# Patient Record
Sex: Male | Born: 1995 | Race: Black or African American | Hispanic: No | Marital: Single | Smoking: Never smoker
Health system: Southern US, Community
[De-identification: ages and names within clinical notes are randomized; demographics above are authoritative.]

## PROBLEM LIST (undated history)

## (undated) DIAGNOSIS — J45909 Unspecified asthma, uncomplicated: Secondary | ICD-10-CM

## (undated) HISTORY — PX: EXTERNAL EAR SURGERY: SHX627

---

## 2017-03-15 ENCOUNTER — Encounter (HOSPITAL_BASED_OUTPATIENT_CLINIC_OR_DEPARTMENT_OTHER): Payer: Self-pay | Admitting: Emergency Medicine

## 2017-03-15 ENCOUNTER — Emergency Department (HOSPITAL_BASED_OUTPATIENT_CLINIC_OR_DEPARTMENT_OTHER): Payer: Federal, State, Local not specified - PPO

## 2017-03-15 ENCOUNTER — Other Ambulatory Visit: Payer: Self-pay

## 2017-03-15 ENCOUNTER — Emergency Department (HOSPITAL_BASED_OUTPATIENT_CLINIC_OR_DEPARTMENT_OTHER)
Admission: EM | Admit: 2017-03-15 | Discharge: 2017-03-15 | Disposition: A | Discharge status: Home / Self Care | Payer: Federal, State, Local not specified - PPO | Attending: Emergency Medicine | Admitting: Emergency Medicine

## 2017-03-15 DIAGNOSIS — Y939 Activity, unspecified: Secondary | ICD-10-CM | POA: Diagnosis not present

## 2017-03-15 DIAGNOSIS — Y929 Unspecified place or not applicable: Secondary | ICD-10-CM | POA: Insufficient documentation

## 2017-03-15 DIAGNOSIS — Y999 Unspecified external cause status: Secondary | ICD-10-CM | POA: Diagnosis not present

## 2017-03-15 DIAGNOSIS — W19XXXA Unspecified fall, initial encounter: Secondary | ICD-10-CM

## 2017-03-15 DIAGNOSIS — S0990XA Unspecified injury of head, initial encounter: Secondary | ICD-10-CM | POA: Diagnosis present

## 2017-03-15 DIAGNOSIS — W000XXA Fall on same level due to ice and snow, initial encounter: Secondary | ICD-10-CM | POA: Insufficient documentation

## 2017-03-15 MED ORDER — IBUPROFEN 400 MG PO TABS
600.0000 mg | ORAL_TABLET | Freq: Once | ORAL | Status: AC
  Administered 2017-03-15: 600 mg via ORAL
  Filled 2017-03-15: qty 1

## 2017-03-15 NOTE — ED Triage Notes (Signed)
Pt slipped on ice this am.  Hit back of head, no loc.  Some dots in vision occasionally.  No N/V.  Pain to touch.

## 2017-03-15 NOTE — Discharge Instructions (Signed)
Ibuprofen or Tylenol for pain. Your imaging and exam today is reassuring. You may have a mild concussion, take it easy for the next 48-72 hours, limit screen time, and rest your brain as we discussed. If you have worsening pain, vomiting, severe headache, vision changes or other concerning symptoms return to the ED for reevaluation, otherwise please call to schedule follow-up appointment with the [facility].

## 2017-03-15 NOTE — ED Notes (Signed)
Patient back from x-ray 

## 2017-03-15 NOTE — ED Provider Notes (Signed)
[facility] EMERGENCY DEPARTMENT Provider Note   CSN: [id number] Arrival date & time: 03/15/17  0957     History   Chief Complaint Chief Complaint  Patient presents with  . Fall    HPI   Chad Conley is a 21 y.o. Male with no pertinent past medical history, presents after she slipped on the ice this morning and fell backwards hitting his head. Patient denies any loss of consciousness reports seeing some dots in his vision occasionally, reports some nausea which has since resolved, no episodes of vomiting. He reports he hit the back left side of his head, which is tender to the touch. Patient also reports some left-sided neck pain that wraps around over his collarbone and sternum. Patient denies any numbness, weakness or tingling of extremities. Patient does report a headache that starts at the back of his head and goes up over the top of his head.       No past medical history on file.  There are no active problems to display for this patient.       Home Medications    Prior to Admission medications   Not on File    Family History No family history on file.  Social History Social History   Tobacco Use  . Smoking status: Never Smoker  . Smokeless tobacco: Never Used  Substance Use Topics  . Alcohol use: Not on file  . Drug use: Not on file     Allergies   Zithromax [azithromycin]   Review of Systems Review of Systems  Constitutional: Negative for chills and fever.  HENT: Negative for ear pain and nosebleeds.   Eyes: Negative for photophobia, pain and visual disturbance.  Respiratory: Negative for shortness of breath.   Cardiovascular: Negative for chest pain.  Gastrointestinal: Negative for abdominal pain, nausea and vomiting.  Musculoskeletal: Positive for myalgias and neck pain. Negative for arthralgias, gait problem and joint swelling.  Skin: Negative for rash and wound.  Neurological: Positive for headaches. Negative for dizziness,  seizures, syncope, facial asymmetry, speech difficulty, weakness, light-headedness and numbness.     Physical Exam Updated Vital Signs BP 127/61 (BP Location: Right Arm)   Pulse 72   Temp 98.7 F (37.1 C) (Oral)   Resp 14   Ht 5\' 9"  (1.753 m)   Wt 127 kg (280 lb)   SpO2 99%   BMI 41.35 kg/m   Physical Exam  Constitutional: He appears well-developed and well-nourished. No distress.  HENT:  Mild tenderness to palpation over left occiput with small hematoma palpable, no obvious deformity or step off, no battle sign, raccoon eyes or hemotympanum, bilateral TMs clear, no bony tenderness over the face, nose normal, posterior oropharynx clear and moist  Eyes: EOM are normal. Pupils are equal, round, and reactive to light. Right eye exhibits no discharge. Left eye exhibits no discharge.  No pain with EOMs  Neck:  C-spine nontender to palpation at midline or paraspinally some tenderness over the left lateral neck to palpation, nontender on the right, full range of motion of the neck in all directions with only minimal discomfort, no crepitus or deformity on palpation  Cardiovascular: Normal rate, regular rhythm, normal heart sounds and intact distal pulses.  Pulmonary/Chest: Effort normal and breath sounds normal. No stridor. No respiratory distress. He has no wheezes. He has no rales. He exhibits tenderness.  Patient does have some mild tenderness over the left clavicle and head of the sternum, no palpable crepitus or deformity, no tenderness over  the lateral ribs, chest expansion bilaterally, lungs clear to auscultation with good air movement throughout  Abdominal: Soft. Bowel sounds are normal. He exhibits no distension and no mass. There is no tenderness. There is no guarding.  Musculoskeletal:  T-spine and L-spine nontender to palpation at midline or paraspinally, no tenderness throughout the lumbar or thoracic back, normal range of motion.  All joints supple and easily movable without  notable deformity, all compartments soft  Neurological: He is alert. Coordination normal.  Speech is clear, able to follow commands CN III-XII intact Normal strength in upper and lower extremities bilaterally including dorsiflexion and plantar flexion, strong and equal grip strength Sensation normal to light and sharp touch Moves extremities without ataxia, coordination intact Normal finger to nose and rapid alternating movements No pronator drift  Skin: Skin is warm and dry. Capillary refill takes less than 2 seconds. He is not diaphoretic.  No lacerations or abrasions  Psychiatric: He has a normal mood and affect. His behavior is normal.  Nursing note and vitals reviewed.    ED Treatments / Results  Labs (all labs ordered are listed, but only abnormal results are displayed) Labs Reviewed - No data to display  EKG  EKG Interpretation None       Radiology Dg Chest 2 View  Result Date: 03/15/2017 CLINICAL DATA:  Fall.  Chest discomfort.  Nausea. EXAM: CHEST  2 VIEW COMPARISON:  No prior. FINDINGS: Mediastinum hilar structures normal. Heart size normal. No focal infiltrate. No pleural effusion or pneumothorax. No displaced rib fracture scratched it no acute bony abnormality identified. IMPRESSION: No active cardiopulmonary disease. Electronically Signed   By: Maisie Fushomas  Register   On: 03/15/2017 12:53   Ct Head Wo Contrast  Result Date: 03/15/2017 CLINICAL DATA:  Minor head trauma. Fall with posterior head injury. Initial encounter. EXAM: CT HEAD WITHOUT CONTRAST TECHNIQUE: Contiguous axial images were obtained from the base of the skull through the vertex without intravenous contrast. COMPARISON:  None. FINDINGS: Brain: No evidence of infarction, hemorrhage, hydrocephalus, extra-axial collection or mass lesion/mass effect. Vascular: No hyperdense vessel or unexpected calcification. Skull: Normal. Negative for fracture or focal lesion. Sinuses/Orbits: Negative. IMPRESSION: Negative  head CT. Electronically Signed   By: Marnee SpringJonathon  Watts M.D.   On: 03/15/2017 12:53    Procedures Procedures (including critical care time)  Medications Ordered in ED Medications  ibuprofen (ADVIL,MOTRIN) tablet 600 mg (600 mg Oral Given 03/15/17 1314)     Initial Impression / Assessment and Plan / ED Course  I have reviewed the triage vital signs and the nursing notes.  Pertinent labs & imaging results that were available during my care of the patient were reviewed by me and considered in my medical decision making (see chart for details).  Patient presents after he slipped on the ice and hit the back of his head, no LOC, the patient complaining of persistent headache since then. There is tenderness and mild swelling over the left occiput. No neurologic deficits on exam, and no signs of basilar skull fracture, but given continued headache and swelling will get a noncontrasted head CT to ensure no fracture or intracranial abnormality. C-spine cleared via Nexus criteria, tenderness likely due to muscle strain. Patient did have some mild tenderness over the sternum and left clavicle will get chest x-ray to rule out acute bony abnormality.  CT head without acute fracture or intracranial abnormality, and chest x-ray negative for fractures or active cardiopulmonary disease. Patient likely has mild concussion. Tylenol/ibuprofen for pain, discussed brain rest  for the next few days, and follow up with PCP. Strict return precautions discussed patient expresses understanding and is in agreement with plan.  Final Clinical Impressions(s) / ED Diagnoses   Final diagnoses:  Injury of head, initial encounter  Fall, initial encounter    ED Discharge Orders    None       Dartha LodgeFord, Averie Meiner N, [place] JerseyPA-C 03/16/17 0813    Ward, Layla MawKristen N, DO 03/21/17 2304

## 2019-04-14 ENCOUNTER — Encounter (HOSPITAL_COMMUNITY): Payer: Self-pay | Admitting: Emergency Medicine

## 2019-04-14 ENCOUNTER — Other Ambulatory Visit: Payer: Self-pay

## 2019-04-14 ENCOUNTER — Emergency Department (HOSPITAL_COMMUNITY): Payer: Federal, State, Local not specified - PPO

## 2019-04-14 ENCOUNTER — Emergency Department (HOSPITAL_COMMUNITY)
Admission: EM | Admit: 2019-04-14 | Discharge: 2019-04-14 | Disposition: A | Discharge status: Home / Self Care | Payer: Federal, State, Local not specified - PPO | Attending: Emergency Medicine | Admitting: Emergency Medicine

## 2019-04-14 DIAGNOSIS — M545 Low back pain, unspecified: Secondary | ICD-10-CM

## 2019-04-14 DIAGNOSIS — Y999 Unspecified external cause status: Secondary | ICD-10-CM | POA: Insufficient documentation

## 2019-04-14 DIAGNOSIS — Y93I9 Activity, other involving external motion: Secondary | ICD-10-CM | POA: Diagnosis not present

## 2019-04-14 DIAGNOSIS — M25512 Pain in left shoulder: Secondary | ICD-10-CM | POA: Diagnosis present

## 2019-04-14 DIAGNOSIS — J45909 Unspecified asthma, uncomplicated: Secondary | ICD-10-CM | POA: Diagnosis not present

## 2019-04-14 DIAGNOSIS — Y92410 Unspecified street and highway as the place of occurrence of the external cause: Secondary | ICD-10-CM | POA: Insufficient documentation

## 2019-04-14 HISTORY — DX: Unspecified asthma, uncomplicated: J45.909

## 2019-04-14 MED ORDER — METHOCARBAMOL 500 MG PO TABS: 500.0000 mg | ORAL_TABLET | Freq: Two times a day (BID) | ORAL | 0 refills | Status: AC

## 2019-04-14 MED ORDER — NAPROXEN 500 MG PO TABS: 500.0000 mg | ORAL_TABLET | Freq: Two times a day (BID) | ORAL | 0 refills | Status: AC

## 2019-04-14 MED ORDER — METHOCARBAMOL 500 MG PO TABS
500.0000 mg | ORAL_TABLET | Freq: Once | ORAL | Status: AC
  Administered 2019-04-14: 500 mg via ORAL
  Filled 2019-04-14: qty 1

## 2019-04-14 MED ORDER — ACETAMINOPHEN 500 MG PO TABS
1000.0000 mg | ORAL_TABLET | Freq: Once | ORAL | Status: AC
  Administered 2019-04-14: 1000 mg via ORAL
  Filled 2019-04-14: qty 2
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
## 2019-04-14 NOTE — Discharge Instructions (Signed)

## 2019-04-18 ENCOUNTER — Telehealth: Payer: Self-pay | Admitting: Orthopedic Surgery

## 2019-04-18 NOTE — Telephone Encounter (Signed)
Pt called in said he was seen at [facility] on 04-14-2019 for Acute pain of left shoulder/ Lumbar back pain and was told to follow up with dr.dean in 1 week and as of right now dr.dean schedule is booked 2 weeks out next available is 04-30-2019. When would you like to get him worked in, so I can call pt to schedule?  [phone]

## 2019-04-19 ENCOUNTER — Ambulatory Visit (INDEPENDENT_AMBULATORY_CARE_PROVIDER_SITE_OTHER): Payer: Federal, State, Local not specified - PPO

## 2019-04-19 ENCOUNTER — Ambulatory Visit (INDEPENDENT_AMBULATORY_CARE_PROVIDER_SITE_OTHER): Payer: Federal, State, Local not specified - PPO | Admitting: Orthopedic Surgery

## 2019-04-19 ENCOUNTER — Encounter: Payer: Self-pay | Admitting: Orthopedic Surgery

## 2019-04-19 ENCOUNTER — Other Ambulatory Visit: Payer: Self-pay

## 2019-04-19 DIAGNOSIS — M542 Cervicalgia: Secondary | ICD-10-CM

## 2019-04-19 DIAGNOSIS — M792 Neuralgia and neuritis, unspecified: Secondary | ICD-10-CM | POA: Diagnosis not present

## 2019-04-19 DIAGNOSIS — M545 Low back pain, unspecified: Secondary | ICD-10-CM

## 2019-04-19 MED ORDER — PREDNISONE 10 MG (21) PO TBPK: ORAL_TABLET | ORAL | 0 refills | Status: AC

## 2019-04-19 NOTE — Telephone Encounter (Signed)
IC s/w patient he will try to make it this morning for an appointment at 845.  If not he will call back and reschedule to Dr Alfonso Patten next available appt
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
## 2019-05-02 ENCOUNTER — Other Ambulatory Visit: Payer: Self-pay

## 2019-05-02 ENCOUNTER — Ambulatory Visit (INDEPENDENT_AMBULATORY_CARE_PROVIDER_SITE_OTHER): Payer: Federal, State, Local not specified - PPO | Admitting: Physical Therapy

## 2019-05-02 ENCOUNTER — Encounter: Payer: Self-pay | Admitting: Physical Therapy

## 2019-05-02 DIAGNOSIS — M25512 Pain in left shoulder: Secondary | ICD-10-CM

## 2019-05-02 DIAGNOSIS — M545 Low back pain, unspecified: Secondary | ICD-10-CM

## 2019-05-02 DIAGNOSIS — M542 Cervicalgia: Secondary | ICD-10-CM | POA: Diagnosis not present
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
## 2019-05-02 NOTE — Patient Instructions (Signed)
Access Code: [email or web address] URL: [email or web address] Date: 05/02/2019 Prepared by: Ivery Quale  Exercises Seated Cervical Sidebending Stretch - 3 sets - 30 hold - 2x daily - 6x weekly Seated Cervical Retraction - 10 reps - 3 sets - 2x daily - 6x weekly Seated Assisted Cervical Rotation with Towel - 10 reps - 1-2 sets - 5 hold - 2x daily - 6x weekly Standing Shoulder Row with Anchored Resistance - 10 reps - 2-3 sets - 2x daily - 6x weekly Shoulder Extension with Resistance - Neutral - 10 reps - 2-3 sets - 2x daily - 6x weekly Supine Lower Trunk Rotation - 10 reps - 1 sets - 5 sec hold - 2x daily - 6x weekly Supine Bridge - 10 reps - 1-2 sets - 5 hold - 2x daily - 6x weekly Child's Pose Stretch - 3 sets - 30 hold - 2x daily - 6x weekly Supine Shoulder Flexion with Dowel - 10 reps - 1-2 sets - 2x daily - 6x weekly Patient Education TENS Therapy

## 2019-05-17 ENCOUNTER — Other Ambulatory Visit: Payer: Self-pay

## 2019-05-17 ENCOUNTER — Ambulatory Visit (INDEPENDENT_AMBULATORY_CARE_PROVIDER_SITE_OTHER): Payer: Federal, State, Local not specified - PPO | Admitting: Orthopedic Surgery

## 2019-05-17 DIAGNOSIS — M542 Cervicalgia: Secondary | ICD-10-CM | POA: Diagnosis not present

## 2019-05-18 ENCOUNTER — Telehealth: Payer: Self-pay | Admitting: Physical Therapy

## 2019-05-18 ENCOUNTER — Encounter: Payer: Self-pay | Admitting: Orthopedic Surgery

## 2019-05-18 ENCOUNTER — Encounter: Payer: Federal, State, Local not specified - PPO | Admitting: Physical Therapy

## 2019-05-18 NOTE — Telephone Encounter (Signed)
Preferred contact number is unavailable/ out of service at this time. Attempted to call home number but was informed it was the wrong number.  Dezmen Alcock PT, DPT, LAT, ATC  05/18/19  2:27 PM
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
## 2019-05-25 ENCOUNTER — Encounter: Payer: Self-pay | Admitting: Physical Therapy

## 2019-05-25 ENCOUNTER — Other Ambulatory Visit: Payer: Self-pay

## 2019-05-25 ENCOUNTER — Ambulatory Visit (INDEPENDENT_AMBULATORY_CARE_PROVIDER_SITE_OTHER): Payer: Federal, State, Local not specified - PPO | Admitting: Physical Therapy

## 2019-05-25 DIAGNOSIS — M542 Cervicalgia: Secondary | ICD-10-CM

## 2019-05-25 DIAGNOSIS — M25512 Pain in left shoulder: Secondary | ICD-10-CM | POA: Diagnosis not present

## 2019-05-25 DIAGNOSIS — M545 Low back pain, unspecified: Secondary | ICD-10-CM
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
## 2019-05-30 ENCOUNTER — Other Ambulatory Visit: Payer: Self-pay

## 2019-05-30 ENCOUNTER — Ambulatory Visit (INDEPENDENT_AMBULATORY_CARE_PROVIDER_SITE_OTHER): Payer: Federal, State, Local not specified - PPO | Admitting: Physical Therapy

## 2019-05-30 DIAGNOSIS — M542 Cervicalgia: Secondary | ICD-10-CM

## 2019-05-30 DIAGNOSIS — M25512 Pain in left shoulder: Secondary | ICD-10-CM | POA: Diagnosis not present

## 2019-05-30 DIAGNOSIS — M545 Low back pain, unspecified: Secondary | ICD-10-CM

## 2019-05-30 NOTE — Patient Instructions (Signed)
Access Code: [email or web address]  URL: [email or web address]  Date: 05/30/2019  Prepared by: Ivery Quale   Exercises  Seated Cervical Sidebending Stretch - 3 sets - 30 hold - 2x daily - 6x weekly  Standing Shoulder Row with Anchored Resistance - 10 reps - 2-3 sets - 2x daily - 6x weekly  Standing Shoulder Horizontal Abduction with Resistance - 10 reps - 3 sets - 2x daily - 6x weekly  Shoulder Extension with Resistance - Neutral - 10 reps - 2-3 sets - 2x daily - 6x weekly  Standing Shoulder External Rotation with Resistance - 10 reps - 1-3 sets - 2x daily - 6x weekly  Seated Hamstring Stretch - 2 reps - 1 sets - 30 hold - 2x daily - 6x weekly  Supine Lower Trunk Rotation - 10 reps - 1 sets - 5 sec hold - 2x daily - 6x weekly  Supine Bridge - 10 reps - 1-2 sets - 5 hold - 2x daily - 6x weekly  Bird Dog - 10 reps - 1 sets - 5 sec hold - 2x daily - 6x weekly  Cat Cow - 10 reps - 1 sets - 5 hold - 2x daily - 6x weekly

## 2019-05-30 NOTE — Therapy (Signed)
[place], [place], [address] Phone: [phone]   Fax:  [phone]  Physical Therapy Treatment  Patient Details  Name: Chad Conley MRN: [id number] Date of Birth: [birth year] Referring Provider (PT): Donella Stade, [place]   Encounter Date: 05/30/2019  PT End of Session - 05/30/19 1241    Visit Number  3    Number of Visits  12    Date for PT Re-Evaluation  06/13/19    PT Start Time  1102    PT Stop Time  1147    PT Time Calculation (min)  45 min    Activity Tolerance  Patient tolerated treatment well    Behavior During Therapy  [facility] for tasks assessed/performed       Past Medical History:  Diagnosis Date  . Asthma     Past Surgical History:  Procedure Laterality Date  . EXTERNAL EAR SURGERY      There were no vitals filed for this visit.  Subjective Assessment - 05/30/19 1123    Subjective  Pain overall not bad today about 3/10 for back. My neck and shoulder are not bothering me today    Pertinent History  no significant PMH    Limitations  Lifting;Standing;Walking;House hold activities    How long can you stand comfortably?  depends    How long can you walk comfortably?  10 min    Diagnostic tests  "x-rays were negative for any acute fracture/dislocation and the only finding was loss of lordosis in the lumbar spine."    Pain Onset  More than a month ago                       [facility] Adult PT Treatment/Exercise - 05/30/19 0001      Lumbar Exercises: Stretches   Active Hamstring Stretch  Right;Left;2 reps;30 seconds    Active Hamstring Stretch Limitations  seated    Single Knee to Chest Stretch  Right;Left;2 reps;30 seconds    Double Knee to Chest Stretch Limitations  10 reps X 10 seconds with feet on Pball    Lower Trunk Rotation Limitations  20 reps      Lumbar Exercises: Aerobic   Nustep  L6 x 6 min UE/LE       Lumbar Exercises: Supine   Bridge  15 reps    Bridge Limitations   with feet on pball    Straight Leg Raise  20 reps      Lumbar Exercises: Quadruped   Madcat/Old Horse  10 reps    Opposite Arm/Leg Raise  10 reps      Shoulder Exercises: Standing   Horizontal ABduction  20 reps    Theraband Level (Shoulder Horizontal ABduction)  Level 3 (Green)    External Rotation Limitations  bilat ER with L3 green X 20 reps    Extension  20 reps    Theraband Level (Shoulder Extension)  Level 3 (Green)    Row  20 reps    Theraband Level (Shoulder Row)  Level 3 (Green)      Manual Therapy   Manual therapy comments  MET contract relax with passive hamsting stretchin on Lt side.                   PT Long Term Goals - 05/02/19 2012      PT LONG TERM GOAL #1   Title  Pt will be I and compliant with HEP. (Target for all goals 6  weeks 06/13/19)    Status  New      PT LONG TERM GOAL #2   Title  Pt will improve overal neck/lumbar ROM to WNL.    Status  New      PT LONG TERM GOAL #3   Title  Pt will reduce overall pain to no more than 2/10 with ususal activity.    Status  New            Plan - 05/30/19 1239    Clinical Impression Statement  His neck and shoulder are doing well so focused primarily on mid/low back strength progression with good tolreance and without complaints. He was given updated HEP to reflect his progress. Continue POC    PT Treatment/Interventions  ADLs/Self Care Home Management;Cryotherapy;Electrical Stimulation;Moist Heat;Iontophoresis 18m/ml Dexamethasone;Traction;Ultrasound;Therapeutic activities;Therapeutic exercise;Neuromuscular re-education;Manual techniques;Passive range of motion;Taping;Spinal Manipulations;Joint Manipulations    PT Next Visit Plan  needs neck/lumbar strengtening and core/general exercises, SIJ involvement of L low back stretching hamstrings and strengthening hip flexors    PT Home Exercise Plan  Access Code: [email or web address] hamstring stretch (supine/ seated), MET of hip flexors, SRL    Consulted and Agree  with Plan of Care  Patient       Patient will benefit from skilled therapeutic intervention in order to improve the following deficits and impairments:  Decreased activity tolerance, Decreased range of motion, Decreased strength, Difficulty walking, Impaired flexibility, Increased muscle spasms, Increased fascial restricitons, Pain  Visit Diagnosis: Cervicalgia  Acute pain of left shoulder  Acute bilateral low back pain without sciatica     Problem List There are no problems to display for this patient.   BSilvestre Mesi2/24/2021, 12:42 PM  CGrand[facility] CenterPhysical Therapy [address] [address]Phone: [phone]  Fax:  [phone] Name: Chad GenovaMRN: [id number] of Birth: [birth year]

## 2019-06-01 ENCOUNTER — Ambulatory Visit (INDEPENDENT_AMBULATORY_CARE_PROVIDER_SITE_OTHER): Payer: Federal, State, Local not specified - PPO | Admitting: Physical Therapy

## 2019-06-01 ENCOUNTER — Other Ambulatory Visit: Payer: Self-pay

## 2019-06-01 DIAGNOSIS — M542 Cervicalgia: Secondary | ICD-10-CM

## 2019-06-01 DIAGNOSIS — M25512 Pain in left shoulder: Secondary | ICD-10-CM | POA: Diagnosis not present

## 2019-06-01 DIAGNOSIS — M545 Low back pain, unspecified: Secondary | ICD-10-CM
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
## 2019-06-05 ENCOUNTER — Other Ambulatory Visit: Payer: Self-pay

## 2019-06-05 ENCOUNTER — Ambulatory Visit (INDEPENDENT_AMBULATORY_CARE_PROVIDER_SITE_OTHER): Payer: Federal, State, Local not specified - PPO | Admitting: Physical Therapy

## 2019-06-05 DIAGNOSIS — M25512 Pain in left shoulder: Secondary | ICD-10-CM

## 2019-06-05 DIAGNOSIS — M545 Low back pain, unspecified: Secondary | ICD-10-CM

## 2019-06-05 DIAGNOSIS — M542 Cervicalgia: Secondary | ICD-10-CM
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
## 2019-06-07 ENCOUNTER — Ambulatory Visit (INDEPENDENT_AMBULATORY_CARE_PROVIDER_SITE_OTHER): Payer: Federal, State, Local not specified - PPO | Admitting: Physical Therapy

## 2019-06-07 ENCOUNTER — Other Ambulatory Visit: Payer: Self-pay

## 2019-06-07 DIAGNOSIS — M545 Low back pain, unspecified: Secondary | ICD-10-CM

## 2019-06-07 DIAGNOSIS — M542 Cervicalgia: Secondary | ICD-10-CM | POA: Diagnosis not present

## 2019-06-07 DIAGNOSIS — M25512 Pain in left shoulder: Secondary | ICD-10-CM

## 2019-06-07 NOTE — Therapy (Signed)
[place], [place], [address] Phone: [phone]   Fax:  [phone]  Physical Therapy Treatment  Patient Details  Name: Chad Conley MRN: [id number] Date of Birth: 26-Feb-1996 Referring Provider (PT): Donella Stade, [place]   Encounter Date: 06/07/2019  PT End of Session - 06/07/19 1012    Visit Number  6    Number of Visits  12    Date for PT Re-Evaluation  06/13/19    PT Start Time  0938    PT Stop Time  1016    PT Time Calculation (min)  38 min    Activity Tolerance  Patient tolerated treatment well    Behavior During Therapy  [facility] for tasks assessed/performed       Past Medical History:  Diagnosis Date  . Asthma     Past Surgical History:  Procedure Laterality Date  . EXTERNAL EAR SURGERY      There were no vitals filed for this visit.  Subjective Assessment - 06/07/19 0958    Subjective  My back is doing pretty good today, did not have back pain until the end of my work shift.    Pertinent History  no significant PMH    Limitations  Lifting;Standing;Walking;House hold activities    How long can you stand comfortably?  depends    How long can you walk comfortably?  10 min    Diagnostic tests  "x-rays were negative for any acute fracture/dislocation and the only finding was loss of lordosis in the lumbar spine."    Pain Onset  More than a month ago            [facility] Adult PT Treatment/Exercise - 06/07/19 0001      Lumbar Exercises: Stretches   Single Knee to Chest Stretch  Right;Left;2 reps;30 seconds    Lower Trunk Rotation Limitations  10 reps holding 5 sec ea    Other Lumbar Stretch Exercise  seated lumbar flexion 20 sec X 3     Other Lumbar Stretch Exercise  quadriped thread the needle for thoracic rotation X 5 reps holding 10 seconds      Lumbar Exercises: Aerobic   Tread Mill  6 min, 3.mph      Lumbar Exercises: Standing   Row Limitations  bent over UE supported row 20 lb X 20 reps each  side    Other Standing Lumbar Exercises  farmers carry with 20 lb KB one lap for Lt, then one lap for Rt. KB curl and OH press  X15 ea side    Other Standing Lumbar Exercises  KB deadlift from 4 inch step 20 lbs  X15 reps, SL RDL with UE support 10 lbs X 15. goblet squat 20 lbs 2/x10 reps,          PT Long Term Goals - 06/05/19 1023      PT LONG TERM GOAL #1   Title  Pt will be I and compliant with HEP. (Target for all goals 6 weeks 06/13/19)    Status  On-going      PT LONG TERM GOAL #2   Title  Pt will improve overal neck/lumbar ROM to WNL.    Status  On-going      PT LONG TERM GOAL #3   Title  Pt will reduce overall pain to no more than 2/10 with ususal activity.    Status  On-going            Plan - 06/07/19 1013  Clinical Impression Statement  Continued with lumbar stretching and progression of functional strength program with good tolerance. He is overall progressing very well with PT.    PT Treatment/Interventions  ADLs/Self Care Home Management;Cryotherapy;Electrical Stimulation;Moist Heat;Iontophoresis 51m/ml Dexamethasone;Traction;Ultrasound;Therapeutic activities;Therapeutic exercise;Neuromuscular re-education;Manual techniques;Passive range of motion;Taping;Spinal Manipulations;Joint Manipulations    PT Next Visit Plan  lumbar stretching, progress functional strength as tolerated    PT Home Exercise Plan  Access Code: [email or web address] hamstring stretch (supine/ seated), MET of hip flexors, SRL    Consulted and Agree with Plan of Care  Patient       Patient will benefit from skilled therapeutic intervention in order to improve the following deficits and impairments:  Decreased activity tolerance, Decreased range of motion, Decreased strength, Difficulty walking, Impaired flexibility, Increased muscle spasms, Increased fascial restricitons, Pain  Visit Diagnosis: Cervicalgia  Acute pain of left shoulder  Acute bilateral low back pain without sciatica     Problem  List There are no problems to display for this patient.   BDebbe Odea PT,DPT 06/07/2019, 10:19 AM  CPres[facility] CenterPhysical Therapy [address] [address]Phone: [phone]  Fax:  [phone] Name: Chad BonsellMRN: [id number] of Birth: 703-01-1996

## 2019-06-12 ENCOUNTER — Other Ambulatory Visit: Payer: Self-pay

## 2019-06-12 ENCOUNTER — Ambulatory Visit (INDEPENDENT_AMBULATORY_CARE_PROVIDER_SITE_OTHER): Payer: Federal, State, Local not specified - PPO | Admitting: Physical Therapy

## 2019-06-12 DIAGNOSIS — M545 Low back pain, unspecified: Secondary | ICD-10-CM

## 2019-06-12 DIAGNOSIS — M25512 Pain in left shoulder: Secondary | ICD-10-CM

## 2019-06-12 DIAGNOSIS — M542 Cervicalgia: Secondary | ICD-10-CM

## 2019-06-12 NOTE — Therapy (Signed)
[place], [place], [address] Phone: [phone]   Fax:  [phone]  Physical Therapy Treatment  Patient Details  Name: Chad Conley MRN: [id number] Date of Birth: 05/27/1995 Referring Provider (PT): Donella Stade, [place]   Encounter Date: 06/12/2019  PT End of Session - 06/12/19 1010    Visit Number  7    Number of Visits  12    Date for PT Re-Evaluation  06/13/19    PT Start Time  0935    PT Stop Time  1015    PT Time Calculation (min)  40 min    Activity Tolerance  Patient tolerated treatment well       Past Medical History:  Diagnosis Date  . Asthma     Past Surgical History:  Procedure Laterality Date  . EXTERNAL EAR SURGERY      There were no vitals filed for this visit.  Subjective Assessment - 06/12/19 0942    Subjective  My back is doing pretty good today but am having some soreness in my back and some cramping in my shins.    Pertinent History  no significant PMH    Limitations  Lifting;Standing;Walking;House hold activities    How long can you stand comfortably?  depends    How long can you walk comfortably?  10 min    Diagnostic tests  "x-rays were negative for any acute fracture/dislocation and the only finding was loss of lordosis in the lumbar spine."    Pain Onset  More than a month ago         [facility] Adult PT Treatment/Exercise - 06/12/19 0001      Lumbar Exercises: Stretches   Single Knee to Chest Stretch  Right;Left;2 reps;30 seconds    Lower Trunk Rotation Limitations  10 reps holding 5 sec ea    Other Lumbar Stretch Exercise  child pose stretch for lumbar and anterior shin stretching 30 sec X 3      Lumbar Exercises: Aerobic   Tread Mill  6 min, 3.mph      Lumbar Exercises: Standing   Other Standing Lumbar Exercises  farmers carry with 20 lb KB one lap for Lt, then one lap for Rt. KB curl and OH press  X15 ea side, Bent over row with one UE support 20 lbs X 20 reps ea side     Other Standing Lumbar Exercises  KB deadlift from 2 inch step 20 lbs 2X10 reps, SL RDL with UE support 10 lbs X 15. goblet squat 20 lbs 2/x10 reps to chair      Manual Therapy   Manual therapy comments  manual stretching for posterior and anterior shin       PT Long Term Goals - 06/05/19 1023      PT LONG TERM GOAL #1   Title  Pt will be I and compliant with HEP. (Target for all goals 6 weeks 06/13/19)    Status  On-going      PT LONG TERM GOAL #2   Title  Pt will improve overal neck/lumbar ROM to WNL.    Status  On-going      PT LONG TERM GOAL #3   Title  Pt will reduce overall pain to no more than 2/10 with ususal activity.    Status  On-going            Plan - 06/12/19 1012    Clinical Impression Statement  Had some complaints of cramping in his shin  today but still able to perform his strength and conditioning program with good tolerance and with good efforts. He is now able to repetitively lift and carry 20 lbs at a time safely and without pain. PT will continue to build upon this as able.    PT Treatment/Interventions  ADLs/Self Care Home Management;Cryotherapy;Electrical Stimulation;Moist Heat;Iontophoresis 64m/ml Dexamethasone;Traction;Ultrasound;Therapeutic activities;Therapeutic exercise;Neuromuscular re-education;Manual techniques;Passive range of motion;Taping;Spinal Manipulations;Joint Manipulations    PT Next Visit Plan  lumbar stretching, progress functional strength as tolerated    PT Home Exercise Plan  Access Code: [email or web address] hamstring stretch (supine/ seated), MET of hip flexors, SRL    Consulted and Agree with Plan of Care  Patient       Patient will benefit from skilled therapeutic intervention in order to improve the following deficits and impairments:  Decreased activity tolerance, Decreased range of motion, Decreased strength, Difficulty walking, Impaired flexibility, Increased muscle spasms, Increased fascial restricitons, Pain  Visit  Diagnosis: Cervicalgia  Acute pain of left shoulder  Acute bilateral low back pain without sciatica     Problem List There are no problems to display for this patient.   BDebbe Odea, PT,DPT 06/12/2019, 10:23 AM  CShadow [facility] SystemPhysical Therapy [address] [address]Phone: [phone]  Fax:  [phone] Name: Chad ReggioMRN: [id number] of Birth: [birth year]

## 2019-06-14 ENCOUNTER — Ambulatory Visit (INDEPENDENT_AMBULATORY_CARE_PROVIDER_SITE_OTHER): Payer: Federal, State, Local not specified - PPO | Admitting: Physical Therapy

## 2019-06-14 ENCOUNTER — Other Ambulatory Visit: Payer: Self-pay

## 2019-06-14 DIAGNOSIS — M545 Low back pain, unspecified: Secondary | ICD-10-CM

## 2019-06-14 DIAGNOSIS — M25512 Pain in left shoulder: Secondary | ICD-10-CM | POA: Diagnosis not present

## 2019-06-14 DIAGNOSIS — M542 Cervicalgia: Secondary | ICD-10-CM

## 2019-06-14 NOTE — Therapy (Signed)
[facility] [place] [place], [place], [address] Phone: [phone]   Fax:  [phone]  Physical Therapy Treatment  Patient Details  Name: Chad Conley MRN: [id number] Date of Birth: May 21, 1995 Referring Provider (PT): Donella Stade, [place]   Encounter Date: 06/14/2019  PT End of Session - 06/14/19 0950    Visit Number  8    Number of Visits  12    Date for PT Re-Evaluation  06/29/19    PT Start Time  6503   he arrives 14 min late   PT Stop Time  1015    PT Time Calculation (min)  31 min    Activity Tolerance  Patient tolerated treatment well    Behavior During Therapy  [facility] for tasks assessed/performed       Past Medical History:  Diagnosis Date  . Asthma     Past Surgical History:  Procedure Laterality Date  . EXTERNAL EAR SURGERY      There were no vitals filed for this visit.  Subjective Assessment - 06/14/19 0949    Subjective  my back is a little sore but overall its doing good    Pertinent History  no significant PMH    Limitations  Lifting;Standing;Walking;House hold activities    How long can you stand comfortably?  depends    How long can you walk comfortably?  10 min    Diagnostic tests  "x-rays were negative for any acute fracture/dislocation and the only finding was loss of lordosis in the lumbar spine."    Pain Onset  More than a month ago           [facility] Adult PT Treatment/Exercise - 06/14/19 0001      Lumbar Exercises: Stretches   Single Knee to Chest Stretch  Right;Left;2 reps;30 seconds    Lower Trunk Rotation Limitations  10 reps holding 5 sec ea    Other Lumbar Stretch Exercise  child pose stretch for lumbar and anterior shin stretching 30 sec X 3      Lumbar Exercises: Aerobic   Recumbent Bike  6 min L3      Lumbar Exercises: Standing   Other Standing Lumbar Exercises  farmers carry with 25 lb KB one lap for Lt, then one lap for Rt. Bent over row with one UE support 25 lbs X 20 reps ea side.      Other Standing Lumbar Exercises  KB deadlift from floor 20 lbs 2X10 reps, SL RDL without UE support 10 lbs X 15. goblet squat 15 lbs 2/x10 reps to chair                  PT Long Term Goals - 06/05/19 1023      PT LONG TERM GOAL #1   Title  Pt will be I and compliant with HEP. (Target for all goals 6 weeks 06/13/19)    Status  On-going      PT LONG TERM GOAL #2   Title  Pt will improve overal neck/lumbar ROM to WNL.    Status  On-going      PT LONG TERM GOAL #3   Title  Pt will reduce overall pain to no more than 2/10 with ususal activity.    Status  On-going            Plan - 06/14/19 0951    Clinical Impression Statement  arrives 14 min late so some exercises/activiteis held due to time retraints. Overall he was made good progress  with PT, recommending 4 more visits of PT to safely progress his lifting/carrying then discharge.    Examination-Activity Limitations  Bend;Carry;Squat;Reach Overhead;Locomotion Level;Lift;Stand    PT Treatment/Interventions  ADLs/Self Care Home Management;Cryotherapy;Electrical Stimulation;Moist Heat;Iontophoresis 29m/ml Dexamethasone;Traction;Ultrasound;Therapeutic activities;Therapeutic exercise;Neuromuscular re-education;Manual techniques;Passive range of motion;Taping;Spinal Manipulations;Joint Manipulations    PT Next Visit Plan  lumbar stretching, progress functional strength as tolerated    PT Home Exercise Plan  Access Code: [email or web address] hamstring stretch (supine/ seated), MET of hip flexors, SRL    Consulted and Agree with Plan of Care  Patient       Patient will benefit from skilled therapeutic intervention in order to improve the following deficits and impairments:  Decreased activity tolerance, Decreased range of motion, Decreased strength, Difficulty walking, Impaired flexibility, Increased muscle spasms, Increased fascial restricitons, Pain  Visit Diagnosis: Cervicalgia  Acute pain of left shoulder  Acute bilateral low  back pain without sciatica     Problem List There are no problems to display for this patient.   BDebbe Odea PT,DPT 06/14/2019, 10:21 AM  [facility] [address] [address]Phone: [phone]  Fax:  [phone] Name: Chad GallagaMRN: [id number] of Birth: [birth year]

## 2019-06-20 ENCOUNTER — Encounter: Payer: Self-pay | Admitting: Physical Therapy

## 2019-06-20 ENCOUNTER — Ambulatory Visit (INDEPENDENT_AMBULATORY_CARE_PROVIDER_SITE_OTHER): Payer: Federal, State, Local not specified - PPO | Admitting: Physical Therapy

## 2019-06-20 ENCOUNTER — Other Ambulatory Visit: Payer: Self-pay

## 2019-06-20 DIAGNOSIS — M542 Cervicalgia: Secondary | ICD-10-CM

## 2019-06-20 DIAGNOSIS — M545 Low back pain, unspecified: Secondary | ICD-10-CM

## 2019-06-20 DIAGNOSIS — M25512 Pain in left shoulder: Secondary | ICD-10-CM

## 2019-06-20 NOTE — Therapy (Addendum)
[facility] [place], [place], [address] Phone: [phone]   Fax:  [phone]  Physical Therapy Treatment Discharge  Patient Details  Name: Chad Conley MRN: [id number] Date of Birth: 1995-05-03 Referring Provider (PT): Donella Stade, [place]   Encounter Date: 06/20/2019  PT End of Session - 06/20/19 1412    Visit Number  9    Number of Visits  12    Date for PT Re-Evaluation  06/29/19    PT Start Time  0867    PT Stop Time  1446    PT Time Calculation (min)  38 min    Activity Tolerance  Patient tolerated treatment well    Behavior During Therapy  [facility] for tasks assessed/performed       Past Medical History:  Diagnosis Date  . Asthma     Past Surgical History:  Procedure Laterality Date  . EXTERNAL EAR SURGERY      There were no vitals filed for this visit.  Subjective Assessment - 06/20/19 1410    Subjective  Pt arriving to therapy just after his shift ended at [facility]. Pt reports he may be a little tired. No pain reported.    Pertinent History  no significant PMH    Limitations  Lifting;Standing;Walking;House hold activities    How long can you stand comfortably?  depends    How long can you walk comfortably?  10 min    Diagnostic tests  "x-rays were negative for any acute fracture/dislocation and the only finding was loss of lordosis in the lumbar spine."    Currently in Pain?  No/denies                       [facility] Adult PT Treatment/Exercise - 06/20/19 0001      Lumbar Exercises: Stretches   Single Knee to Chest Stretch  Right;Left;2 reps;30 seconds    Hip Flexor Stretch  Right;Left;1 rep;60 seconds    Other Lumbar Stretch Exercise  child pose stretch for lumbar and anterior shin stretching 30 sec X 3      Lumbar Exercises: Aerobic   Recumbent Bike  6 min L4      Lumbar Exercises: Standing   Other Standing Lumbar Exercises  Airex: diagnal "X" pattern with LE, BOSU ball partial squats dome  up x 10     Other Standing Lumbar Exercises  KB deadlift from floor 20 lbs 2X10 reps, SL RDL without UE support 10 lbs X 15. goblet squat 15 lbs 2/x10 reps to chair      Shoulder Exercises: Standing   Row  AROM;Strengthening;20 reps;Theraband    Theraband Level (Shoulder Row)  Level 3 (Green)    Diagonals  AROM;Strengthening;Both;Theraband    Theraband Level (Shoulder Diagonals)  Level 3 (Green)    Other Standing Exercises  15# kettle bell swings x 20 reps             PT Education - 06/20/19 1412    Education Details  reviewed HEP verbally    Person(s) Educated  Patient    Methods  Explanation    Comprehension  Verbalized understanding          PT Long Term Goals - 06/20/19 1413      PT LONG TERM GOAL #1   Title  Pt will be I and compliant with HEP. (Target for all goals 6 weeks 06/13/19)    Status  On-going      PT LONG TERM GOAL #2  Title  Pt will improve overal neck/lumbar ROM to WNL.    Status  On-going      PT LONG TERM GOAL #3   Title  Pt will reduce overall pain to no more than 2/10 with ususal activity.    Status  On-going            Plan - 06/20/19 1446    Clinical Impression Statement  Pt arriving to therapy reporting no pain at rest. Pt tolerating all exericses focusing on core and lift/carry. 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    Examination-Activity Limitations  Bend;Carry;Squat;Reach Overhead;Locomotion Level;Lift;Stand    Examination-Participation Restrictions  Community Activity;Cleaning;Shop;Other    Stability/Clinical Decision Making  Stable/Uncomplicated    Rehab Potential  Good    PT Frequency  2x / week    PT Duration  6 weeks    PT Treatment/Interventions  ADLs/Self Care Home Management;Cryotherapy;Electrical Stimulation;Moist Heat;Iontophoresis 66m/ml Dexamethasone;Traction;Ultrasound;Therapeutic activities;Therapeutic exercise;Neuromuscular  re-education;Manual techniques;Passive range of motion;Taping;Spinal Manipulations;Joint Manipulations    PT Next Visit Plan  lumbar stretching, progress functional strength as tolerated    PT Home Exercise Plan  Access Code: [email or web address] hamstring stretch (supine/ seated), MET of hip flexors, SRL    Consulted and Agree with Plan of Care  Patient       Patient will benefit from skilled therapeutic intervention in order to improve the following deficits and impairments:  Decreased activity tolerance, Decreased range of motion, Decreased strength, Difficulty walking, Impaired flexibility, Increased muscle spasms, Increased fascial restricitons, Pain  Visit Diagnosis: Cervicalgia  Acute pain of left shoulder  Acute bilateral low back pain without sciatica  PHYSICAL THERAPY DISCHARGE SUMMARY  Visits from Start of Care: 9  Current functional level related to goals / functional outcomes: see above  Remaining deficits: See above   Education / Equipment: HEP Plan: Patient agrees to discharge.  Patient goals were not met. Patient is being discharged due to not returning since the last visit.  ?????        Problem List There are no problems to display for this patient.   JOretha Caprice PT 06/20/2019, 2:48 PM  [facility] [address] [address]Phone: [phone]  Fax:  [phone] Name: JTaz VannessMRN: [id number] of Birth: 7Sep 05, 1997

## 2019-06-22 ENCOUNTER — Encounter: Payer: Federal, State, Local not specified - PPO | Admitting: Physical Therapy

## 2019-06-26 ENCOUNTER — Encounter: Payer: Federal, State, Local not specified - PPO | Admitting: Physical Therapy

## 2019-06-29 ENCOUNTER — Telehealth: Payer: Self-pay | Admitting: Physical Therapy

## 2019-06-29 ENCOUNTER — Encounter: Payer: Federal, State, Local not specified - PPO | Admitting: Physical Therapy

## 2019-06-29 NOTE — Telephone Encounter (Signed)
Pt no show for PT appointment today. They were contacted and informed of this and he states he had to cancel his appointments this week as he is back at work and unable to have time to come to PT. He thought he had cancelled this appointment. He states he is doing well and does not need to set up any PT appointments at this time. He requests his exercises be sent to him and they were texted to his mobile number using [employer] HEP program. Exercises are listed below. He was informed PT will keep his case open for 30 more days in the event he feels he needs to return and that if we do not hear from him we will discharge in 30 days. He had no further questions or concerns.  Access Code: [email or web address]: [email or web address]: 03/26/2021Prepared by: Arlys John NelsonExercises  Seated Cervical Sidebending Stretch - 2 x daily - 6 x weekly - 3 sets - 30 hold  Seated Hamstring Stretch - 2 x daily - 6 x weekly - 2 reps - 1 sets - 30 hold  Supine Lower Trunk Rotation - 2 x daily - 6 x weekly - 10 reps - 1 sets - 5 sec hold  Single Knee to Chest Stretch - 2 x daily - 6 x weekly - 2 reps - 1 sets - 30 hold  Child's Pose Stretch - 2 x daily - 6 x weekly - 3 sets - 30 hold  Standing Shoulder Row with Anchored Resistance - 2 x daily - 6 x weekly - 10 reps - 2-3 sets  Shoulder Extension with Resistance - Neutral - 2 x daily - 6 x weekly - 10 reps - 2-3 sets  Single Leg Deadlift with Kettlebell - 2 x daily - 6 x weekly - 10 reps - 3 sets  Kettlebell Swing - 2 x daily - 6 x weekly - 10 reps - 3 sets  Goblet Squat with Kettlebell - 2 x daily - 6 x weekly - 10 reps - 3 sets  Half Kneeling Diagonal Lift with Medicine Ball - 2 x daily - 6 x weekly - 10 reps - 3 sets  Ivery Quale, PT, DPT 06/29/19 10:53 AM
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
